# Patient Record
Sex: Male | Born: 1990 | Race: White | Hispanic: No | Marital: Single | Smoking: Never smoker
Health system: Southern US, Community
[De-identification: ages and names within clinical notes are randomized; demographics above are authoritative.]

## PROBLEM LIST (undated history)

## (undated) DIAGNOSIS — S0990XA Unspecified injury of head, initial encounter: Secondary | ICD-10-CM

## (undated) DIAGNOSIS — J189 Pneumonia, unspecified organism: Secondary | ICD-10-CM

## (undated) DIAGNOSIS — J45909 Unspecified asthma, uncomplicated: Secondary | ICD-10-CM

---
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
## 2011-05-02 ENCOUNTER — Emergency Department (HOSPITAL_COMMUNITY)
Admission: EM | Admit: 2011-05-02 | Discharge: 2011-05-02 | Disposition: A | Discharge status: Home / Self Care | Payer: Self-pay | Attending: Emergency Medicine | Admitting: Emergency Medicine

## 2011-05-02 DIAGNOSIS — J020 Streptococcal pharyngitis: Secondary | ICD-10-CM | POA: Insufficient documentation

## 2011-05-02 DIAGNOSIS — J351 Hypertrophy of tonsils: Secondary | ICD-10-CM | POA: Insufficient documentation

## 2011-10-17 HISTORY — PX: OTHER SURGICAL HISTORY: SHX169

## 2012-08-19 ENCOUNTER — Emergency Department (HOSPITAL_COMMUNITY)
Admission: EM | Admit: 2012-08-19 | Discharge: 2012-08-19 | Disposition: A | Discharge status: Home / Self Care | Payer: Managed Care, Other (non HMO) | Attending: Emergency Medicine | Admitting: Emergency Medicine

## 2012-08-19 ENCOUNTER — Emergency Department (HOSPITAL_COMMUNITY): Payer: Managed Care, Other (non HMO)

## 2012-08-19 ENCOUNTER — Encounter (HOSPITAL_COMMUNITY): Payer: Self-pay | Admitting: *Deleted

## 2012-08-19 DIAGNOSIS — J45909 Unspecified asthma, uncomplicated: Secondary | ICD-10-CM

## 2012-08-19 DIAGNOSIS — R059 Cough, unspecified: Secondary | ICD-10-CM | POA: Insufficient documentation

## 2012-08-19 DIAGNOSIS — J029 Acute pharyngitis, unspecified: Secondary | ICD-10-CM | POA: Insufficient documentation

## 2012-08-19 DIAGNOSIS — R5383 Other fatigue: Secondary | ICD-10-CM | POA: Insufficient documentation

## 2012-08-19 DIAGNOSIS — J069 Acute upper respiratory infection, unspecified: Secondary | ICD-10-CM | POA: Insufficient documentation

## 2012-08-19 DIAGNOSIS — Z87828 Personal history of other (healed) physical injury and trauma: Secondary | ICD-10-CM | POA: Insufficient documentation

## 2012-08-19 DIAGNOSIS — Z8701 Personal history of pneumonia (recurrent): Secondary | ICD-10-CM | POA: Insufficient documentation

## 2012-08-19 DIAGNOSIS — R51 Headache: Secondary | ICD-10-CM | POA: Insufficient documentation

## 2012-08-19 DIAGNOSIS — R6883 Chills (without fever): Secondary | ICD-10-CM | POA: Insufficient documentation

## 2012-08-19 DIAGNOSIS — J3489 Other specified disorders of nose and nasal sinuses: Secondary | ICD-10-CM | POA: Insufficient documentation

## 2012-08-19 DIAGNOSIS — R05 Cough: Secondary | ICD-10-CM

## 2012-08-19 DIAGNOSIS — R5381 Other malaise: Secondary | ICD-10-CM | POA: Insufficient documentation

## 2012-08-19 DIAGNOSIS — J45901 Unspecified asthma with (acute) exacerbation: Secondary | ICD-10-CM | POA: Insufficient documentation

## 2012-08-19 HISTORY — DX: Pneumonia, unspecified organism: J18.9

## 2012-08-19 HISTORY — DX: Unspecified injury of head, initial encounter: S09.90XA

## 2012-08-19 HISTORY — DX: Unspecified asthma, uncomplicated: J45.909

## 2012-08-19 MED ORDER — ALBUTEROL SULFATE (5 MG/ML) 0.5% IN NEBU
5.0000 mg | INHALATION_SOLUTION | Freq: Once | RESPIRATORY_TRACT | Status: AC
  Administered 2012-08-19: 5 mg via RESPIRATORY_TRACT
  Filled 2012-08-19: qty 1

## 2012-08-19 MED ORDER — AZITHROMYCIN 250 MG PO TABS: 250.0000 mg | ORAL_TABLET | Freq: Every day | ORAL | Status: DC

## 2012-08-19 MED ORDER — ALBUTEROL SULFATE HFA 108 (90 BASE) MCG/ACT IN AERS: 1.0000 | INHALATION_SPRAY | Freq: Four times a day (QID) | RESPIRATORY_TRACT | Status: DC | PRN

## 2012-08-19 NOTE — ED Notes (Signed)
Pt reports productive cough and chest congestion x1 week - unknown fever. Productive cough w/ green phlegm. Occasional body aches and chills - has been taking OTC medications w/ minimal relief. A&Ox4, no acute distress, resp even and unlabored.

## 2012-08-19 NOTE — Discharge Instructions (Signed)
Mr Gerald Zamora the chest x-ray shows no pneumonia today.  Use the inhaler no more than 4 times a day.  Start the antibiotics as soon as possible.  Take ibuprofen 600mg  every 6 hours with food x 24 hours.  Follow up with a pcp from the list below or one of your choice. Return to Er for uncontrolled fever, n/v or any concerns.  RESOURCE GUIDE  Chronic Pain Problems: Contact [facility]  [phone] Patients need to be referred by their primary care doctor.  Insufficient Money for Medicine: Contact United Way:  call "211" or [facility] (218)[phone].  No Primary Care Doctor: - Call [facility]  [phone] - can help you locate a primary care doctor that  accepts your insurance, provides certain services, etc. - Physician Referral Service- [phone]  Agencies that provide inexpensive medical care: - [facility]  [phone] - [facility]  [phone] - [facility]  [phone] - Women's Clinic  [phone] - Planned Parenthood  [phone] [facility]  [phone]  Medicaid-accepting [facility] Providers: - [facility]- [address]  [phone], Mon-Fri 9am-7pm, Sat 9am-1pm - [facility]- [address] [place], [address] [place]  [phone] - [facility]- [address] [place]  [phone] [facility]- [address]  [phone] - Renaye Rakers- [address] [place], [address], [phone]  Only accepts [place] patients after they have their name  applied to their card  Self Pay (no insurance) in [place]: - Sickle Cell Patients: Dr Willey Blade, [facility]  [address] [place], [phone] - [facility]- [address] [place]  [phone]       [facility]- [address]       -     [facility]- see information above (Speak to [employer]  if you do not have insurance)       -  [facility]- [address] [place], [phone]       -  [facility]- [address],  [phone]       -  [facility]- [address] [phone]       -  Dr Julio Sicks-  [address], [place], [phone]       -  [facility]- [address], [phone]       -  [facility]- [address] [phone], also [address], [phone]       -    [facility]- [address] [place], [phone], 1st & 3rd Saturday   every month, 10am-1pm  1) Find a Doctor and Pay Out of Pocket Although you won't have to find out who is covered by your insurance plan, it is a good idea to ask around and get recommendations. You will then need to call the office and see if the doctor you have chosen will accept you as a new patient and what types of options they offer for patients who are self-pay. Some doctors offer discounts or will set up payment plans for their patients who do not have insurance, but you will need to ask so you aren't surprised when you get to your appointment.  2) Contact Your Local  Health Department Not all health departments have doctors that can see patients for sick visits, but many do, so it is worth a call to see if yours does. If you don't know where your local health department is, you can check in your phone book. The CDC also has a tool to help you locate your state's health department, and many state websites also have listings of all of their local health departments.  3) Find a Walk-in Clinic If your illness is not likely to be very severe or complicated, you may want to try a walk in clinic. These are popping up all over the country in pharmacies, drugstores, and shopping centers. They're usually staffed by nurse practitioners or physician assistants that have been trained to treat common illnesses and complaints. They're usually fairly quick and inexpensive. However, if  you have serious medical issues or chronic medical problems, these are probably not your best option  STD Testing - [facility] Department of [facility], STD Clinic, [address] [place], phone [phone] or [phone].  Monday - Friday, call for an appointment. [facility] Department of [employer], STD Clinic, [address], [place], phone [phone] or [phone].  Monday - Friday, call for an appointment.  Abuse/Neglect: [facility] Child Abuse Hotline 817-864-0489 [facility] Child Abuse Hotline 810-553-6459 (After Hours)  Emergency Shelter:  [facility] (867) 376-5547  Maternity Homes: - [facility] (216) 880-2082 - [facility] (972)844-8689  MRSA Hotline #:   930 759 8928  [facility] Resources  [facility]  United [employer] [facility] Dept. [address]                 [address]         [address] [place] [address]  Blondell Reveal Phone:  [phone]                                  Phone:  [phone]                   Phone:  [phone]  [facility], [phone] - [phone]       -     [facility] in [place], [address]                                  [phone]) [phone], [facility] Child Abuse Hotline (873) 873-3946 or (808)104-0577 (After Hours)   Behavioral Health Services  Substance Abuse Resources: - [facility] Drug [facility]  4060600599 - [facility] 914-405-0877 - [facility] 947-189-5229 Floydene Flock (726)313-2797 - Residential & Outpatient Substance Abuse Program  (901)642-2456  Psychological Services: [facility]  [phone] [facility]  ([phone] -  [facility], [place] [address] [place], ACCESS LINE: [phone] or [phone], [email or web address]  Dental Assistance  If unable to pay or uninsured, contact:  [facility] or [facility]. to become qualified for the adult dental clinic.  Patients with Medicaid: [facility] [phone] [address], [phone] [address], [phone]  If unable to pay, or uninsured, contact [facility] [phone]) or [facility] Department [phone] in [place], [phone] in [facility]) to become qualified for the adult dental clinic  Other Low-Cost Community Dental Services: - [facility]- [address] [place], [place], [place], [address], [phone], [phone], 2nd and 4th Thursday of the month at 6:30am.  10 clients each day by appointment, can sometimes see walk-in patients if someone does not show for an appointment. [facility]- [address] [place], [place], [address], [phone] - [facility]- [address] [place], [place], [address], [phone] [facility] Health Department- [phone] [facility] Health Department- [phone] [facility] [phone]    Cough, Adult  A cough is a reflex. It helps you clear your throat and airways. A cough can help heal your body. A cough can last 2 or 3 weeks (acute) or may last more than 8 weeks (chronic). Some common causes of a cough can include an infection, allergy, or a cold. HOME CARE  Only take medicine as told by your doctor.  If given, take your medicines (antibiotics) as told. Finish them even if you start to feel better.  Use a cold steam vaporizer or humidier in your home. This can help loosen thick spit (secretions).  Sleep so you are almost sitting up (semi-upright). Use pillows to do this. This helps reduce coughing.  Rest as needed.  Stop smoking if you smoke. GET HELP RIGHT  AWAY IF:  You have yellowish-white fluid (pus) in your thick spit.  Your cough gets worse.  Your medicine does not reduce coughing, and you are losing sleep.  You cough up blood.  You have trouble breathing.  Your pain gets worse and medicine does not help.  You have a fever. MAKE SURE YOU:   Understand these instructions.  Will watch your condition.  Will get help right away if you are not doing well or get worse. Document Released: 06/15/2011 Document Revised: 12/25/2011 Document Reviewed: 06/15/2011 [facility] Patient Information 2013 [place], [place]. Cough, Adult  A cough is a reflex. It helps you clear your throat and airways. A cough can help heal your body. A cough can last 2 or 3 weeks (acute) or may last more than 8 weeks (chronic). Some common causes of a cough can include an infection, allergy, or a cold. HOME CARE  Only take medicine as told by your doctor.  If given, take your medicines (antibiotics) as told. Finish them even if you start to feel better.  Use a cold steam vaporizer or humidier in your home. This can help loosen thick spit (secretions).  Sleep so you are almost sitting up (semi-upright). Use pillows to do this. This helps reduce coughing.  Rest as needed.  Stop smoking if you smoke. GET HELP RIGHT AWAY IF:  You have yellowish-white fluid (pus) in your thick spit.  Your cough gets worse.  Your medicine does not reduce coughing, and you are losing sleep.  You cough up blood.  You have trouble breathing.  Your pain gets worse and medicine does not help.  You  have a fever. MAKE SURE YOU:   Understand these instructions.  Will watch your condition.  Will get help right away if you are not doing well or get worse. Document Released: 06/15/2011 Document Revised: 12/25/2011 Document Reviewed: 06/15/2011 [facility] Patient Information 2013 [place], [place].

## 2012-08-19 NOTE — ED Provider Notes (Signed)
History     CSN: [id number]  Arrival date & time 08/19/12  1847   First MD Initiated Contact with Patient 08/19/12 2137      Chief Complaint  Patient presents with  . Cough    (Consider location/radiation/quality/duration/timing/severity/associated sxs/prior treatment) Patient is a 21 y.o. male presenting with cough. The history is provided by the patient. No language interpreter was used.  Cough This is a new problem. The current episode started more than 1 week ago. The problem occurs every few minutes. The problem has been gradually worsening. The cough is productive of sputum. Associated symptoms include chills, sweats, headaches, rhinorrhea, sore throat and wheezing. Pertinent negatives include no chest pain, no ear congestion, no ear pain and no shortness of breath. He has tried decongestants for the symptoms. He is not a smoker. His past medical history is significant for asthma.   21 year old male with is reports cough greater than 7 days productive and green with chills and body aches.  States that he started wheezing 2 days ago. Denies fever but has had chills and sweating with sore throat. PMH asthma and pneumonia. Does not smoke    Past Medical History  Diagnosis Date  . Asthma   . Pneumonia   . Head trauma     History reviewed. No pertinent past surgical history.  History reviewed. No pertinent family history.  History  Substance Use Topics  . Smoking status: Never Smoker   . Smokeless tobacco: Not on file  . Alcohol Use: Yes     Comment: occasionally      Review of Systems  Constitutional: Positive for chills, diaphoresis and fatigue. Negative for fever.  HENT: Positive for sore throat and rhinorrhea. Negative for ear pain.   Eyes: Negative.   Respiratory: Positive for cough and wheezing. Negative for shortness of breath.   Cardiovascular: Negative.  Negative for chest pain.  Gastrointestinal: Negative.   Neurological: Positive for headaches.    Psychiatric/Behavioral: Negative.   All other systems reviewed and are negative.    Allergies  Review of patient's allergies indicates no known allergies.  Home Medications   Current Outpatient Rx  Name  Route  Sig  Dispense  Refill  . DM-PHENYLEPHRINE-ACETAMINOPHEN 10-5-325 MG/15ML PO LIQD   Oral   Take 15 mLs by mouth 3 (three) times daily as needed. For cold symptoms         . PSEUDOEPH-DOXYLAMINE-DM-APAP 60-12.03-14-999 MG/30ML PO LIQD   Oral   Take 30 mLs by mouth at bedtime as needed. For cold symptoms.Equate Nite Time           BP 138/75  Pulse 90  Temp 99.5 F (37.5 C) (Oral)  Resp 18  Ht 5\' 6"  (1.676 m)  Wt 174 lb (78.926 kg)  BMI 28.08 kg/m2  SpO2 98%  Physical Exam  Nursing note and vitals reviewed. Constitutional: He is oriented to person, place, and time. He appears well-developed and well-nourished.  HENT:  Head: Normocephalic.  Eyes: Conjunctivae normal and EOM are normal. Pupils are equal, round, and reactive to light.  Neck: Normal range of motion. Neck supple.  Cardiovascular: Normal rate.   Pulmonary/Chest: Effort normal. He has wheezes. He exhibits no tenderness.  Abdominal: Soft. He exhibits no distension.  Musculoskeletal: Normal range of motion.  Neurological: He is alert and oriented to person, place, and time.  Skin: Skin is warm and dry.  Psychiatric: He has a normal mood and affect.    ED Course  Procedures (including critical care time)  Labs Reviewed - No data to display Dg Chest 2 View  08/19/2012  *RADIOLOGY REPORT*  Clinical Data: Cough and cold symptoms x 6 days  CHEST - 2 VIEW  Comparison: None.  Findings: Rounded radiodensities projecting over the chest are compatible with buttons on clothing rather than pulmonary nodules.  Cardiac and mediastinal contours appear normal.  The lungs appear clear.  No pleural effusion is identified.  IMPRESSION:  No significant abnormality identified.   Original Report Authenticated By:  Gaylyn Rong, M.D.      No diagnosis found.    MDM  Upper respiratory infection with cough and wheezing.  Albuterol neb in the ER with improvement.  Chest x-ray unremarkable reviewed by myself.  Will use albuterol inhaler, ibuprofen and benadryl.  Understands to return for worsening symptoms with fever and SOB.  rx for z-pack. Follow up with pcp of choice this week.        Remi Haggard, NP 08/20/12 1153

## 2012-08-20 NOTE — ED Provider Notes (Signed)
Medical screening examination/treatment/procedure(s) were performed by non-physician practitioner and as supervising physician I was immediately available for consultation/collaboration.   Vanisha Whiten, MD 08/20/12 1540 

## 2012-09-17 ENCOUNTER — Encounter (HOSPITAL_COMMUNITY): Payer: Self-pay | Admitting: Emergency Medicine

## 2012-09-17 ENCOUNTER — Emergency Department (HOSPITAL_COMMUNITY): Payer: Managed Care, Other (non HMO)

## 2012-09-17 ENCOUNTER — Emergency Department (HOSPITAL_COMMUNITY)
Admission: EM | Admit: 2012-09-17 | Discharge: 2012-09-18 | Disposition: A | Discharge status: Home / Self Care | Payer: Managed Care, Other (non HMO) | Attending: Emergency Medicine | Admitting: Emergency Medicine

## 2012-09-17 DIAGNOSIS — R209 Unspecified disturbances of skin sensation: Secondary | ICD-10-CM | POA: Insufficient documentation

## 2012-09-17 DIAGNOSIS — Y9389 Activity, other specified: Secondary | ICD-10-CM | POA: Insufficient documentation

## 2012-09-17 DIAGNOSIS — R42 Dizziness and giddiness: Secondary | ICD-10-CM | POA: Insufficient documentation

## 2012-09-17 DIAGNOSIS — L02419 Cutaneous abscess of limb, unspecified: Secondary | ICD-10-CM | POA: Insufficient documentation

## 2012-09-17 DIAGNOSIS — L089 Local infection of the skin and subcutaneous tissue, unspecified: Secondary | ICD-10-CM | POA: Insufficient documentation

## 2012-09-17 DIAGNOSIS — Z79899 Other long term (current) drug therapy: Secondary | ICD-10-CM | POA: Insufficient documentation

## 2012-09-17 DIAGNOSIS — Z8701 Personal history of pneumonia (recurrent): Secondary | ICD-10-CM | POA: Insufficient documentation

## 2012-09-17 DIAGNOSIS — J45909 Unspecified asthma, uncomplicated: Secondary | ICD-10-CM | POA: Insufficient documentation

## 2012-09-17 DIAGNOSIS — Y929 Unspecified place or not applicable: Secondary | ICD-10-CM | POA: Insufficient documentation

## 2012-09-17 DIAGNOSIS — L0390 Cellulitis, unspecified: Secondary | ICD-10-CM

## 2012-09-17 DIAGNOSIS — Z87828 Personal history of other (healed) physical injury and trauma: Secondary | ICD-10-CM | POA: Insufficient documentation

## 2012-09-17 DIAGNOSIS — M795 Residual foreign body in soft tissue: Secondary | ICD-10-CM

## 2012-09-17 DIAGNOSIS — W278XXA Contact with other nonpowered hand tool, initial encounter: Secondary | ICD-10-CM | POA: Insufficient documentation

## 2012-09-17 MED ORDER — PIPERACILLIN-TAZOBACTAM 3.375 G IVPB
3.3750 g | Freq: Once | INTRAVENOUS | Status: AC
  Administered 2012-09-17: 3.375 g via INTRAVENOUS
  Filled 2012-09-17: qty 50
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
## 2012-09-17 NOTE — ED Notes (Signed)
Pt states he was splitting wood and a piece of metal off the ax came off and went into his left knee  Pt states he thought the metal came out  Pt has a small laceration noted to the inside of his left knee with redness around the laceration

## 2012-09-18 MED ORDER — CLINDAMYCIN HCL 300 MG PO CAPS: 300.0000 mg | ORAL_CAPSULE | Freq: Four times a day (QID) | ORAL | Status: DC

## 2012-09-18 MED ORDER — HYDROCODONE-ACETAMINOPHEN 5-325 MG PO TABS: 1.0000 | ORAL_TABLET | ORAL | Status: DC | PRN

## 2012-09-18 NOTE — ED Provider Notes (Signed)
Medical screening examination/treatment/procedure(s) were conducted as a shared visit with non-physician practitioner(s) and myself.  I personally evaluated the patient during the encounter.  21 year old male with a soft tissue foreign body in the area of the medial condyle of the left femur. Small area of surrounding cellulitis. Patient does have a small joint effusion but no significant pain with active or passive range of motion of the knee to suggest septic joint. Pease refer to PA procedure note. The foreign body was grasped with forceps and removed. Consistent in size and shape with what was seen on the plain films. It appeared to be removed intact. Wound copiously irrigated. Left open to heal by secondary intention. Plan continued antibiotics and local wound care. No hx of diabetes or other immunocompromising state.  Return precautions discussed. Outpt FU otherwise.  Raeford Razor, MD 09/18/12 [phone]

## 2012-09-21 NOTE — ED Provider Notes (Signed)
Medical screening examination/treatment/procedure(s) were performed by non-physician practitioner and as supervising physician I was immediately available for consultation/collaboration.  Kassie Keng, MD 09/21/12 1625 

## 2012-12-25 IMAGING — CR DG CHEST 2V
2 series · 2 of 2 positions shown · non-contrast
Comparison: None.

CLINICAL DATA: Cough and cold symptoms x 6 days

CHEST - 2 VIEW

[w chest pa]
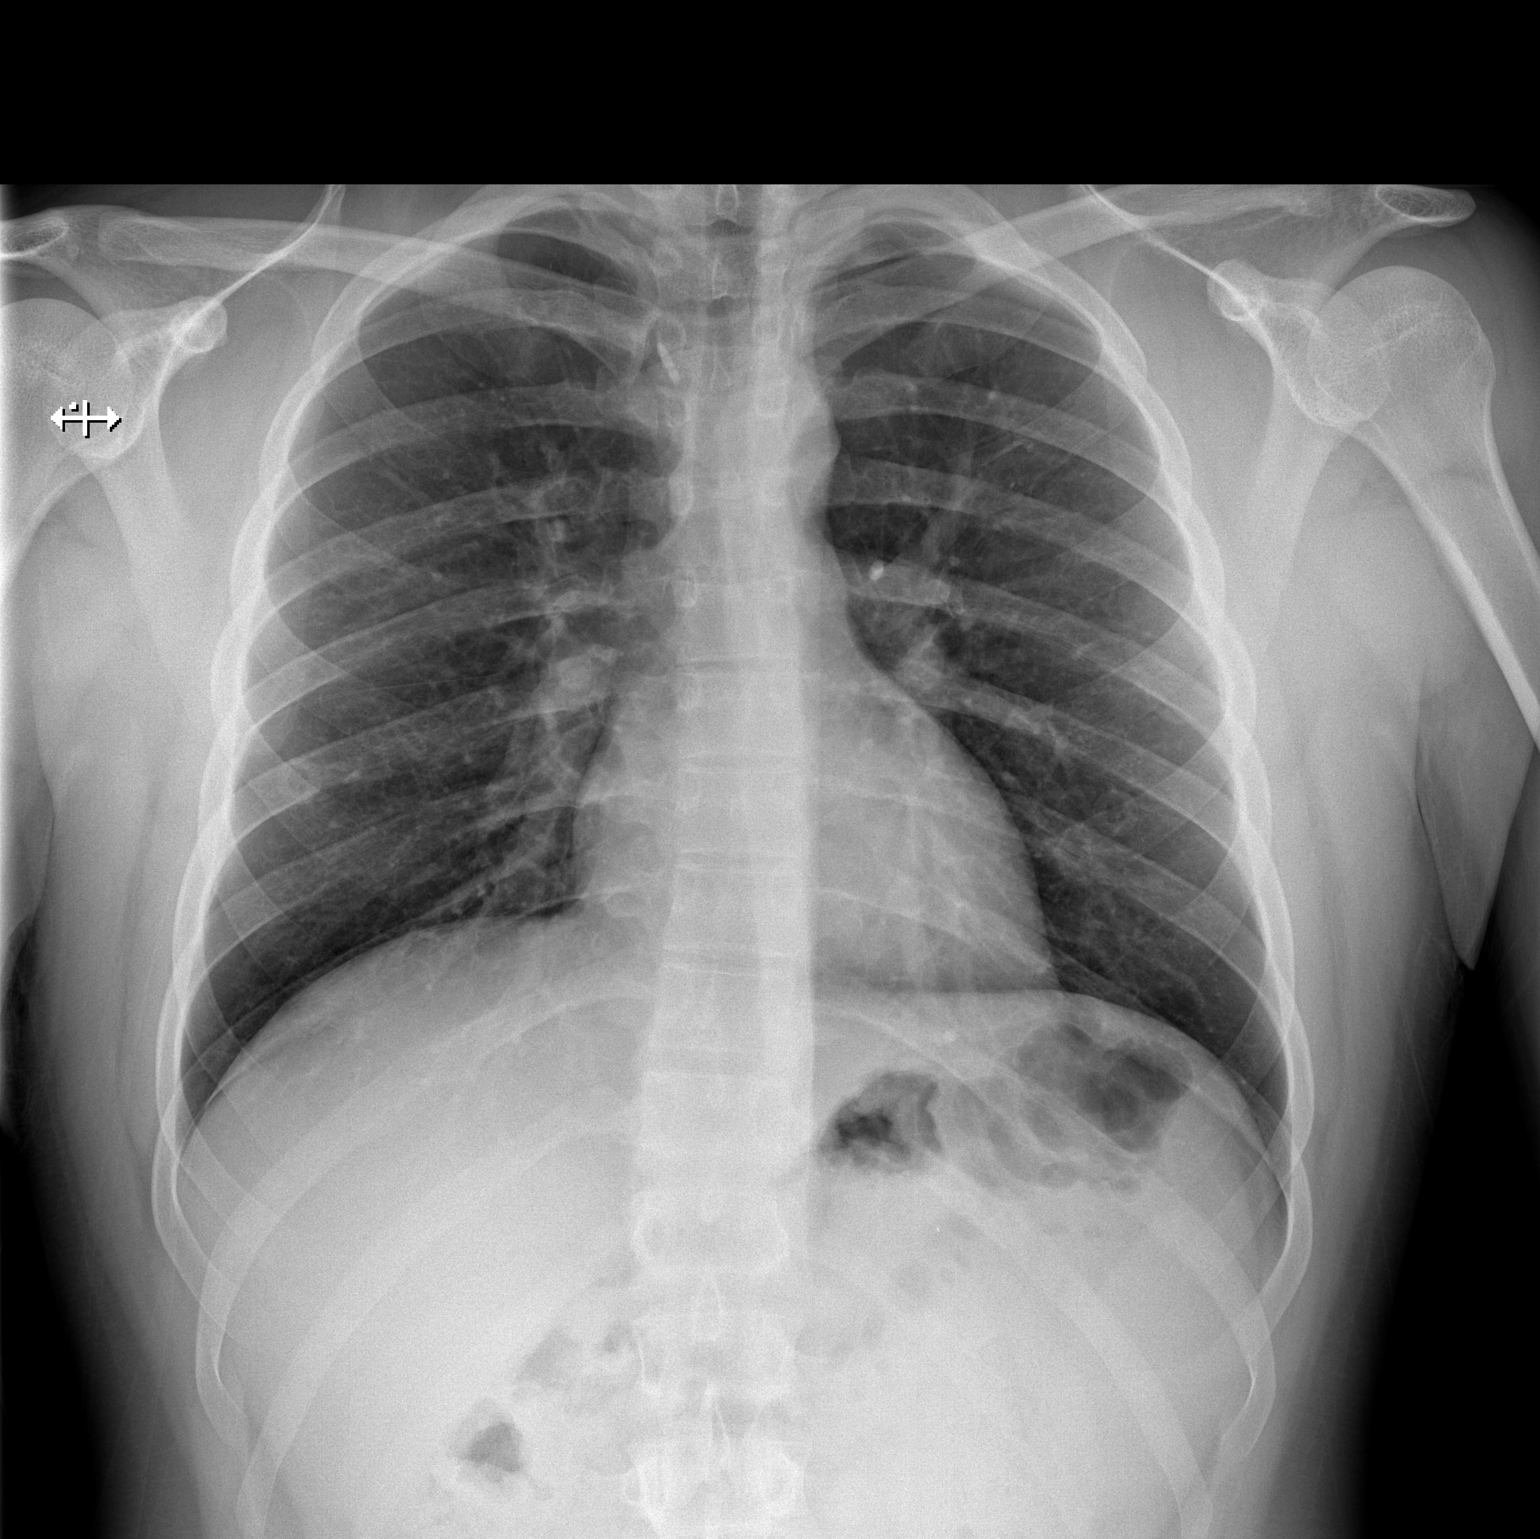

[w chest lat]
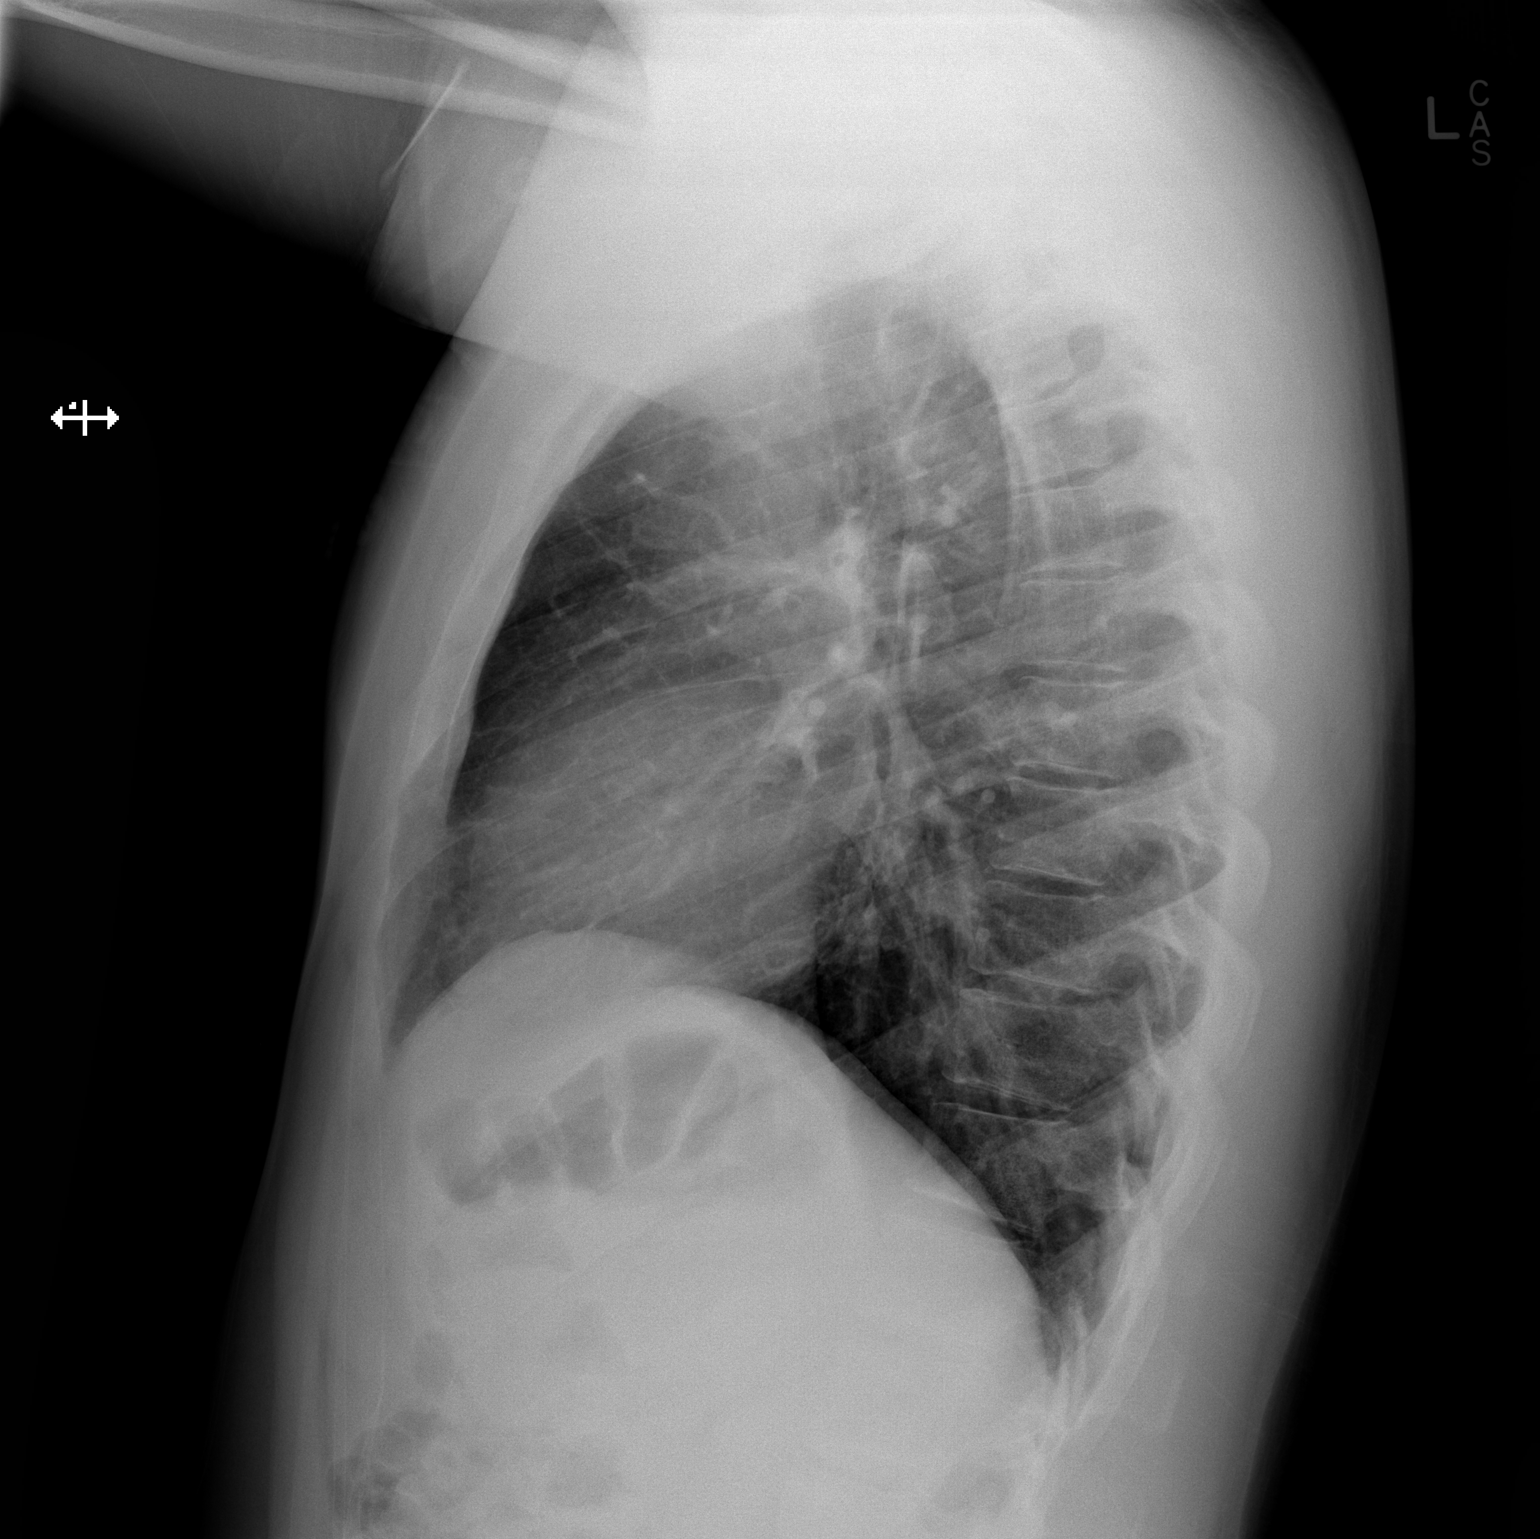

[2 of 2 positions shown; findings below may reference images not displayed]

FINDINGS: Rounded radiodensities projecting over the chest are
compatible with buttons on clothing rather than pulmonary nodules.

Cardiac and mediastinal contours appear normal.

The lungs appear clear.

No pleural effusion is identified.
IMPRESSION: No significant abnormality identified.

## 2014-08-13 ENCOUNTER — Encounter (HOSPITAL_COMMUNITY): Payer: Self-pay | Admitting: Emergency Medicine

## 2014-08-13 ENCOUNTER — Emergency Department (HOSPITAL_COMMUNITY)
Admission: EM | Admit: 2014-08-13 | Discharge: 2014-08-14 | Disposition: A | Discharge status: Home / Self Care | Payer: 59 | Attending: Emergency Medicine | Admitting: Emergency Medicine

## 2014-08-13 DIAGNOSIS — T25022A Burn of unspecified degree of left foot, initial encounter: Secondary | ICD-10-CM | POA: Diagnosis present

## 2014-08-13 DIAGNOSIS — X12XXXA Contact with other hot fluids, initial encounter: Secondary | ICD-10-CM | POA: Diagnosis not present

## 2014-08-13 DIAGNOSIS — Y9389 Activity, other specified: Secondary | ICD-10-CM | POA: Insufficient documentation

## 2014-08-13 DIAGNOSIS — T25222A Burn of second degree of left foot, initial encounter: Secondary | ICD-10-CM | POA: Insufficient documentation

## 2014-08-13 DIAGNOSIS — J45909 Unspecified asthma, uncomplicated: Secondary | ICD-10-CM | POA: Insufficient documentation

## 2014-08-13 DIAGNOSIS — Z8701 Personal history of pneumonia (recurrent): Secondary | ICD-10-CM | POA: Insufficient documentation

## 2014-08-13 DIAGNOSIS — Y9289 Other specified places as the place of occurrence of the external cause: Secondary | ICD-10-CM | POA: Diagnosis not present
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
## 2014-08-14 MED ORDER — HYDROMORPHONE HCL 2 MG/ML IJ SOLN
2.0000 mg | Freq: Once | INTRAMUSCULAR | Status: AC
  Administered 2014-08-14: 2 mg via INTRAVENOUS
  Filled 2014-08-14: qty 1

## 2014-08-14 MED ORDER — SILVER SULFADIAZINE 1 % EX CREA
TOPICAL_CREAM | Freq: Two times a day (BID) | CUTANEOUS | Status: DC
  Administered 2014-08-14: 02:00:00 via TOPICAL
  Filled 2014-08-14 (×2): qty 50

## 2014-08-14 MED ORDER — HYDROMORPHONE HCL 1 MG/ML IJ SOLN
1.0000 mg | Freq: Once | INTRAMUSCULAR | Status: AC
  Administered 2014-08-14: 1 mg via INTRAVENOUS
  Filled 2014-08-14: qty 1

## 2014-08-14 MED ORDER — OXYCODONE-ACETAMINOPHEN 5-325 MG PO TABS: 2.0000 | ORAL_TABLET | ORAL | Status: AC | PRN

## 2014-08-14 MED ORDER — SILVER SULFADIAZINE 1 % EX CREA: TOPICAL_CREAM | Freq: Two times a day (BID) | CUTANEOUS | Status: AC

## 2014-08-14 NOTE — ED Notes (Signed)
Pt chart opened to print work note.

## 2014-08-14 NOTE — ED Notes (Signed)
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
## 2014-08-14 NOTE — Discharge Instructions (Signed)
Keep wound clean, apply silvadene twice a day and apply clean bandage.  Return for recheck to the ED, urgent care, or your doctor in 2 days.   Burn Care Your skin is a natural barrier to infection. It is the largest organ of your body. Burns damage this natural protection. To help prevent infection, it is very important to follow your caregiver's instructions in the care of your burn. Burns are classified as:  First degree. There is only redness of the skin (erythema). No scarring is expected.  Second degree. There is blistering of the skin. Scarring may occur with deeper burns.  Third degree. All layers of the skin are injured, and scarring is expected. HOME CARE INSTRUCTIONS   Wash your hands well before changing your bandage.  Change your bandage as often as directed by your caregiver.  Remove the old bandage. If the bandage sticks, you may soak it off with cool, clean water.  Cleanse the burn thoroughly but gently with mild soap and water.  Pat the area dry with a clean, dry cloth.  Apply a thin layer of antibacterial cream to the burn.  Apply a clean bandage as instructed by your caregiver.  Keep the bandage as clean and dry as possible.  Elevate the affected area for the first 24 hours, then as instructed by your caregiver.  Only take over-the-counter or prescription medicines for pain, discomfort, or fever as directed by your caregiver. SEEK IMMEDIATE MEDICAL CARE IF:   You develop excessive pain.  You develop redness, tenderness, swelling, or red streaks near the burn.  The burned area develops yellowish-white fluid (pus) or a bad smell.  You have a fever. MAKE SURE YOU:   Understand these instructions.  Will watch your condition.  Will get help right away if you are not doing well or get worse. Document Released: 10/02/2005 Document Revised: 12/25/2011 Document Reviewed: 02/22/2011 [facility] Patient Information 2015 [place], [place]. This information is not  intended to replace advice given to you by your health care provider. Make sure you discuss any questions you have with your health care provider.  Second-Degree Burn A second-degree burn affects the 2 outer layers of skin. The outer layer (epidermis) and the layer underneath it (dermis) are both burned. Another name for this type of burn is a partial thickness burn. A second-degree burn may be called minor or major. This depends on the size of the burn. It also depends on what parts of the skin are burned. Minor burns may be treated with first aid. Major burns are a medical emergency. A second-degree burn is worse than a first-degree burn, but not as bad as a third-degree burn. A first-degree burn affects only the epidermis. A third-degree burn goes through all the layers of skin. A second-degree burn usually heals in 3 to 4 weeks. A minor second-degree burn usually does not leave a scar.Deeper second-degree burns may lead to scarring of the skin or contractures over joints.Contractures are scars that form over joints and may lead to reduced mobility at those joints. CAUSES  Heat (thermal) injury. This happens when skin comes in contact with something very hot. It could be a flame, a hot object, hot liquid, or steam. Most second-degree burns are thermal injuries.  Radiation. Sunlight is one type of radiation that can burn the skin. Another type of radiation is used to heat food. Radiation is also used to treat some diseases, such as cancer. All types of radiation can burn the skin. Sunlight usually causes  a first-degree burn. Radiation used for heating food or treating a disease can cause a second-degree burn.  Electricity. Electrical burns can cause more damage under the skin than on the surface. They should always be treated as major burns.  Chemicals. Many chemicals can burn the skin. The burn should be flushed with cool water and checked by an emergency caregiver. SYMPTOMS Symptoms of  second-degree burns include:  Severe pain.  Extreme tenderness.  Deep redness.  Blistered skin.  Skin that has changed color.It might look blotchy, wet, or shiny.  Swelling. TREATMENT Some second-degree burns may need to be treated in a hospital. These include major burns, electrical burns, and chemical burns. Many other second-degree burns can be treated with regular first aid, such as:  Cooling the burn. Use cool, germ-free (sterile) salt water. Place the burned area of skin into a tub of water, or cover the burned area with clean, wet towels.  Taking pain medicine.  Removing the dead skin from broken blisters. A trained caregiver may do this. Do not pop blisters.  Gently washing your skin with mild soap.  Covering the burned area with a cream.Silver sulfadiazine is a cream for burns. An antibiotic cream, such as bacitracin, may also be used to fight infection. Do not use other ointments or creams unless your caregiver says it is okay.  Protecting the burn with a sterile, non-sticky bandage.  Bandaging fingers and toes separately. This keeps them from sticking together.  Taking an antibiotic. This can help prevent infection.  Getting a tetanus shot. HOME CARE INSTRUCTIONS Medication  Take any medicine prescribed by your caregiver. Follow the directions carefully.  Ask your caregiver if you can take over-the-counter medicine to relieve pain and swelling. Do not give aspirin to children.  Make sure your caregiver knows about all other medicines you take.This includes over-the-counter medicines. Burn care  You will need to change the bandage on your burn. You may need to do this 2 or 3 times each day.  Gently clean the burned area.  Put ointment on it.  Cover the burn with a sterile bandage.  For some deeper burns or burns that cover a large area, compression garments may be prescribed. These garments can help minimize scarring and protect your mobility.  Do not  put butter or oil on your skin. Use only the cream prescribed by your caregiver.  Do not put ice on your burn.  Do not break blisters on your skin.  Keep the bandaged area dry. You might need to take a sponge bath for awhile.Ask your caregiver when you can take a shower or a tub bath again.  Do not scratch an itchy burn. Your caregiver may give you medicine to relieve very bad itching.  Infection is a big danger after a second-degree burn. Tell your caregiver right away if you have signs of infection, such as:  Redness or changing color in the burned area.  Fluid leaking from the burn.  Swelling in the burn area.  A bad smell coming from the wound. Follow-up  Keep all follow-up appointments.This is important. This is how your caregiver can tell if your treatment is working.  Protect your burn from sunlight.Use sunscreen whenever you go outside.Burned areas may be sensitive to the sun for up to 1 year. Exposure to the sun may also cause permanent darkening of scars. SEEK MEDICAL CARE IF:  You have any questions about medicines.  You have any questions about your treatment.  You wonder if it  is okay to do a particular activity.  You develop a fever of more than 100.5 F (38.1 C). SEEK IMMEDIATE MEDICAL CARE IF:  You think your burn might be infected. It may change color, become red, leak fluid, swell, or smell bad.  You develop a fever of more than 102 F (38.9 C). Document Released: 03/06/2011 Document Revised: 12/25/2011 Document Reviewed: 03/06/2011 [facility] Patient Information 2015 [place], [place]. This information is not intended to replace advice given to you by your health care provider. Make sure you discuss any questions you have with your health care provider.

## 2014-08-16 ENCOUNTER — Encounter (HOSPITAL_COMMUNITY): Payer: Self-pay | Admitting: Emergency Medicine

## 2014-08-16 ENCOUNTER — Emergency Department (HOSPITAL_COMMUNITY)
Admission: EM | Admit: 2014-08-16 | Discharge: 2014-08-16 | Disposition: A | Discharge status: Home / Self Care | Payer: 59 | Attending: Emergency Medicine | Admitting: Emergency Medicine

## 2014-08-16 DIAGNOSIS — Z87828 Personal history of other (healed) physical injury and trauma: Secondary | ICD-10-CM | POA: Diagnosis not present

## 2014-08-16 DIAGNOSIS — Z8701 Personal history of pneumonia (recurrent): Secondary | ICD-10-CM | POA: Diagnosis not present

## 2014-08-16 DIAGNOSIS — Y9289 Other specified places as the place of occurrence of the external cause: Secondary | ICD-10-CM | POA: Diagnosis not present

## 2014-08-16 DIAGNOSIS — Z79899 Other long term (current) drug therapy: Secondary | ICD-10-CM | POA: Insufficient documentation

## 2014-08-16 DIAGNOSIS — T25212D Burn of second degree of left ankle, subsequent encounter: Secondary | ICD-10-CM | POA: Insufficient documentation

## 2014-08-16 DIAGNOSIS — Y9389 Activity, other specified: Secondary | ICD-10-CM | POA: Diagnosis not present

## 2014-08-16 DIAGNOSIS — J45909 Unspecified asthma, uncomplicated: Secondary | ICD-10-CM | POA: Diagnosis not present

## 2014-08-16 DIAGNOSIS — X12XXXD Contact with other hot fluids, subsequent encounter: Secondary | ICD-10-CM | POA: Insufficient documentation

## 2014-08-16 DIAGNOSIS — IMO0002 Reserved for concepts with insufficient information to code with codable children: Secondary | ICD-10-CM

## 2014-08-16 DIAGNOSIS — T25012D Burn of unspecified degree of left ankle, subsequent encounter: Secondary | ICD-10-CM | POA: Diagnosis present

## 2014-08-16 LAB — CBG MONITORING, ED: Glucose-Capillary: 99 mg/dL (ref 70–99)

## 2014-08-16 MED ORDER — SILVER SULFADIAZINE 1 % EX CREA
TOPICAL_CREAM | Freq: Once | CUTANEOUS | Status: AC
  Administered 2014-08-16: 1 via TOPICAL
  Filled 2014-08-16: qty 50
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
## 2014-08-16 NOTE — Discharge Instructions (Signed)
Please call your doctor for a followup appointment within 24-48 hours. When you talk to your doctor please let them know that you were seen in the emergency department and have them acquire all of your records so that they can discuss the findings with you and formulate a treatment plan to fully care for your new and ongoing problems. Please call and set up an appointment with health and wellness Center to be seen and reassessed Please continue to apply Silvadene to the wound for proper healing Please continue to keep him covered with bandage Please continue to monitor symptoms closely and if symptoms are to worsen or change (fever greater than 101, chills, sweating, nausea, vomiting, chest pain, shortness of breathe, difficulty breathing, weakness, numbness, tingling, worsening or changes to pain pattern, Foot swelling, pus drainage, fever, hot to the touch, red streak running up the leg) please report back to the Emergency Department immediately.    Second-Degree Burn A second-degree burn affects the 2 outer layers of skin. The outer layer (epidermis) and the layer underneath it (dermis) are both burned. Another name for this type of burn is a partial thickness burn. A second-degree burn may be called minor or major. This depends on the size of the burn. It also depends on what parts of the skin are burned. Minor burns may be treated with first aid. Major burns are a medical emergency. A second-degree burn is worse than a first-degree burn, but not as bad as a third-degree burn. A first-degree burn affects only the epidermis. A third-degree burn goes through all the layers of skin. A second-degree burn usually heals in 3 to 4 weeks. A minor second-degree burn usually does not leave a scar.Deeper second-degree burns may lead to scarring of the skin or contractures over joints.Contractures are scars that form over joints and may lead to reduced mobility at those joints. CAUSES  Heat (thermal) injury.  This happens when skin comes in contact with something very hot. It could be a flame, a hot object, hot liquid, or steam. Most second-degree burns are thermal injuries.  Radiation. Sunlight is one type of radiation that can burn the skin. Another type of radiation is used to heat food. Radiation is also used to treat some diseases, such as cancer. All types of radiation can burn the skin. Sunlight usually causes a first-degree burn. Radiation used for heating food or treating a disease can cause a second-degree burn.  Electricity. Electrical burns can cause more damage under the skin than on the surface. They should always be treated as major burns.  Chemicals. Many chemicals can burn the skin. The burn should be flushed with cool water and checked by an emergency caregiver. SYMPTOMS Symptoms of second-degree burns include:  Severe pain.  Extreme tenderness.  Deep redness.  Blistered skin.  Skin that has changed color.It might look blotchy, wet, or shiny.  Swelling. TREATMENT Some second-degree burns may need to be treated in a hospital. These include major burns, electrical burns, and chemical burns. Many other second-degree burns can be treated with regular first aid, such as:  Cooling the burn. Use cool, germ-free (sterile) salt water. Place the burned area of skin into a tub of water, or cover the burned area with clean, wet towels.  Taking pain medicine.  Removing the dead skin from broken blisters. A trained caregiver may do this. Do not pop blisters.  Gently washing your skin with mild soap.  Covering the burned area with a cream.Silver sulfadiazine is a cream  for burns. An antibiotic cream, such as bacitracin, may also be used to fight infection. Do not use other ointments or creams unless your caregiver says it is okay.  Protecting the burn with a sterile, non-sticky bandage.  Bandaging fingers and toes separately. This keeps them from sticking together.  Taking an  antibiotic. This can help prevent infection.  Getting a tetanus shot. HOME CARE INSTRUCTIONS Medication  Take any medicine prescribed by your caregiver. Follow the directions carefully.  Ask your caregiver if you can take over-the-counter medicine to relieve pain and swelling. Do not give aspirin to children.  Make sure your caregiver knows about all other medicines you take.This includes over-the-counter medicines. Burn care  You will need to change the bandage on your burn. You may need to do this 2 or 3 times each day.  Gently clean the burned area.  Put ointment on it.  Cover the burn with a sterile bandage.  For some deeper burns or burns that cover a large area, compression garments may be prescribed. These garments can help minimize scarring and protect your mobility.  Do not put butter or oil on your skin. Use only the cream prescribed by your caregiver.  Do not put ice on your burn.  Do not break blisters on your skin.  Keep the bandaged area dry. You might need to take a sponge bath for awhile.Ask your caregiver when you can take a shower or a tub bath again.  Do not scratch an itchy burn. Your caregiver may give you medicine to relieve very bad itching.  Infection is a big danger after a second-degree burn. Tell your caregiver right away if you have signs of infection, such as:  Redness or changing color in the burned area.  Fluid leaking from the burn.  Swelling in the burn area.  A bad smell coming from the wound. Follow-up  Keep all follow-up appointments.This is important. This is how your caregiver can tell if your treatment is working.  Protect your burn from sunlight.Use sunscreen whenever you go outside.Burned areas may be sensitive to the sun for up to 1 year. Exposure to the sun may also cause permanent darkening of scars. SEEK MEDICAL CARE IF:  You have any questions about medicines.  You have any questions about your treatment.  You  wonder if it is okay to do a particular activity.  You develop a fever of more than 100.5 F (38.1 C). SEEK IMMEDIATE MEDICAL CARE IF:  You think your burn might be infected. It may change color, become red, leak fluid, swell, or smell bad.  You develop a fever of more than 102 F (38.9 C). Document Released: 03/06/2011 Document Revised: 12/25/2011 Document Reviewed: 03/06/2011 [facility] Patient Information 2015 [place], [place]. This information is not intended to replace advice given to you by your health care provider. Make sure you discuss any questions you have with your health care provider.   Emergency Department Resource Guide 1) Find a Doctor and Pay Out of Pocket Although you won't have to find out who is covered by your insurance plan, it is a good idea to ask around and get recommendations. You will then need to call the office and see if the doctor you have chosen will accept you as a new patient and what types of options they offer for patients who are self-pay. Some doctors offer discounts or will set up payment plans for their patients who do not have insurance, but you will need to ask so  you aren't surprised when you get to your appointment.  2) Contact Your Local Health Department Not all health departments have doctors that can see patients for sick visits, but many do, so it is worth a call to see if yours does. If you don't know where your local health department is, you can check in your phone book. The CDC also has a tool to help you locate your state's health department, and many state websites also have listings of all of their local health departments.  3) Find a Walk-in Clinic If your illness is not likely to be very severe or complicated, you may want to try a walk in clinic. These are popping up all over the country in pharmacies, drugstores, and shopping centers. They're usually staffed by nurse practitioners or physician assistants that have been trained to treat  common illnesses and complaints. They're usually fairly quick and inexpensive. However, if you have serious medical issues or chronic medical problems, these are probably not your best option.  No Primary Care Doctor: - Call Health Connect at  [phone] - they can help you locate a primary care doctor that  accepts your insurance, provides certain services, etc. - Physician Referral Service- [phone]  Chronic Pain Problems: Organization         Address  Phone   Notes  [facility]  [phone] Patients need to be referred by their primary care doctor.   Medication Assistance: Organization         Address  Phone   Notes  [facility] Medication [facility] [address] [place] [place], [place] [address] [phone] --Must be a resident of [facility] -- Must have NO insurance coverage whatsoever (no Medicaid/ Medicare, etc.) -- The pt. MUST have a primary care doctor that directs their care regularly and follows them in the community   MedAssist  [phone]   [employer]  [phone]    Agencies that provide inexpensive medical care: Organization         Address  Phone   Notes  [facility]  [phone]   [facility]    [phone]   [facility] [address] [place], [place] [address] [phone]   [facility] [address] [place], [place] [phone]   Planned Parenthood    [phone]   [facility]    [phone]   [facility]  [address], [place] Phone:  [phone], Fax:  [phone] Hours of Operation:  9 am - 6 pm, M-F.  Also accepts Medicaid/Medicare and self-pay.  [facility]  [address], [place] Phone: [phone], Fax: [phone]. Hours of Operation:  8:30 am - 5:30 pm, M-F.  Also accepts Medicaid and self-pay.  [facility] [place] [address], [place] Phone: [phone]   [facility] [address] Natasha Bence [place], [place] [phone], Ext. [phone] Mondays & Thursdays: 7-9 AM.  First 15 patients are seen on a first come, first serve basis.    Medicaid-accepting [facility] Providers:  Organization         Address  Phone   Notes  [facility] [address], [place] [phone] Also accepts self-pay patients.  [facility] [address] Laurell Josephs [place], [place]  [phone]  [facility] [address], [place] [phone]   [facility] [address] [place] [address], [place] [phone]   Renaye RakersVeita Bland [address], [place]   [phone] Only accepts [place] Access [place] patients after they have their name applied to their card.   Self-Pay (no insurance) in [facility]:  Organization         Address  Phone   Notes  Sickle Cell Patients, [facility] [address], [place] [phone]   [facility] [address], [place] [phone]   [facility]  [address], [place] [phone]   [facility]/Dr. Osei-Bonsu  [address] [place] [address], [place] or [phone], [place] [phone] Phone number for both [place] and [place] locations is the same.  [facility] [address], [place] [phone]   [facility] [address] [place] [address], [place] or [address] [phone]   [facility] [address], [place] [phone], phone; [phone], fax Sees patients 1st and 3rd Saturday of every month.  Must not qualify for public or private insurance (i.e. Medicaid, Medicare, [place] Health Choice, Veterans' Benefits)  Household income should be no more than 200% of the  poverty level The clinic cannot treat you if you are pregnant or think you are pregnant  Sexually transmitted diseases are not treated at the clinic.    Dental Care: Organization         Address  Phone  Notes  [facility] [address], [place] [phone] Accepts children up to age 24 who are enrolled in [place]icaid or [place] Health Choice; pregnant women with a Medicaid card; and children who have applied for Medicaid or [place] Health Choice, but were declined, whose parents can pay a reduced fee at time of service.  [facility] [place]  [address], [place] [phone] Accepts children up to age 24 who are enrolled in [place] or [place] Health Choice; pregnant women with a Medicaid card; and children who have applied for Medicaid or [place] Health Choice, but were declined, whose parents can pay a reduced fee at time of service.  [facility]  [address], [place] [phone] Patients are seen by appointment only. Walk-ins are not accepted. [facility] will see patients 24 years of age and older. Monday - Tuesday (8am-5pm) Most Wednesdays (8:30-5pm) $30 per visit, cash only  [facility]  [address], [facility] [phone] Patients are seen by appointment only. Walk-ins are not accepted. [facility] will see patients 24 years of age and older. One Wednesday Evening (Monthly: Volunteer Based).  $30 per visit, cash only  [employer]  [phone] for adults; Children under age 824, call Graduate Pediatric Dentistry at [phone]. Children aged 214-14, please call [phone] to request a pediatric application.  Dental services are provided in all areas of dental care including fillings, crowns and bridges, complete and partial dentures, implants, gum treatment, root canals, and extractions.  Preventive care is also provided. Treatment is provided to both adults and children. Patients are selected via a lottery and there is often a waiting list.  [facility] [address], [place]  [phone] [email or web address]   [facility] [address], [address] [place], [place] [phone], Ext. [phone] Second and Fourth Thursday of each month, opens at 6:30 AM; Clinic ends at 9 AM.  Patients are seen on a first-come first-served basis, and a limited number are seen during each clinic.   [facility]  [address], [place], [place] [phone]   Eligibility Requirements You must have lived in [place], [place], or [place] counties for at least the last three months.   You cannot be eligible for state or federal sponsored National Cityhealthcare insurance, including CIGNAVeterans Administration, [place]icaid, or [employer]Medicare.   You generally cannot be eligible for healthcare insurance through your employer.    How to apply: Eligibility screenings are held every Tuesday and Wednesday afternoon from 1:00 pm until 4:00 pm. You do not need an appointment for the interview!  [facility] [address], [place], [place] [phone]   [facility]  [phone]   [facility]  [phone]   [facility]  [phone]    Behavioral Health Resources in the Community: Intensive Outpatient Programs Organization         Address  Phone  Notes  [facility] [address], [place], [place] [phone]   [facility] [address], [place], [place] [phone]   [facility] [address], [place], [place]  [phone]   [facility] [address],  [place], [place] [phone] or [phone]   Substance Abuse Resources Organization         Address  Phone  Notes  [facility]  [phone]   Addiction  [facility]  [phone]   [facility]  [phone]   [facility]  [phone]   Residential & Outpatient Substance Abuse Program  [phone]   Psychological Services Organization         Address  Phone  Notes  [facility]  [phone]- [phone]   [facility]  [phone]- [phone]   [facility] [address], [place] [phone] or [phone]    Mobile Crisis Teams Organization         Address  Phone  Notes  [facility], Mobile Crisis Care Unit  [phone]   [facility]  [address] [place], [place] [phone]   Doristine LocksSharon DeEsch [address] [place] [phone]    Self-Help/Support Groups Organization         Address  Phone             Notes  [facility] [place] - variety of support groups  336- [phone] Call for more information  Narcotics Anonymous (NA), [facility]aring [facility] [address], [place]  2 meetings at this location   Statisticianesidential Treatment Programs Organization         Address  Phone  Notes  [facility] [address],    [place]  [phone]   [facility]  [address], [place] [id number], [place], [place] [phone]   [facility] [address], [place] [phone] Admissions: 8am-3pm M-F  [facility] [address]    [place], [place] [phone]   [facility] [address], [place], [place] [phone]   [facility] [address]  [place], [place] [phone]   [facility] - Intensive Outpatient [address] Laurell Josephs [address], [place], [place] [phone]   [facility] [address] [place] [address] [place].,  [place], [place] [phone] or [phone]   [facility] ([facility]) [address], [place], [place] [phone] Accepts Medicaid  [facility] [address]  [place]  [phone] Substance Abuse/Addiction Treatment   [facility] Organization         Address  Phone  Notes  [facility]  [phone]   Angie Fava, PhD [address] Ervin Knack [place], [place]   [phone] or [phone]   [facility] Behavioral   [address] [place], [place] [phone]   [facility] [address], [place], [place] [phone] Insurance/Medicaid/sponsorship through [facility] [address]                                    [place], [place] [phone] Therapy/tele-psych/case  [facility] [address], [place] [phone]    Dr. Lolly Mustache  [phone]   [facility] [place]  [employer] [facility] Dept. 1) [address] [place] 2) [address], [place] 3)  [address], [place] [phone]  [phone]   [facility] Child Abuse Hotline 629-523-9068 or [phone] (After Hours)
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
## 2022-11-26 ENCOUNTER — Ambulatory Visit
Admission: EM | Admit: 2022-11-26 | Discharge: 2022-11-26 | Disposition: A | Discharge status: Home / Self Care | Payer: Self-pay | Attending: Internal Medicine | Admitting: Internal Medicine

## 2022-11-26 ENCOUNTER — Ambulatory Visit (INDEPENDENT_AMBULATORY_CARE_PROVIDER_SITE_OTHER): Payer: Self-pay

## 2022-11-26 DIAGNOSIS — J029 Acute pharyngitis, unspecified: Secondary | ICD-10-CM | POA: Insufficient documentation

## 2022-11-26 DIAGNOSIS — R059 Cough, unspecified: Secondary | ICD-10-CM

## 2022-11-26 DIAGNOSIS — R051 Acute cough: Secondary | ICD-10-CM | POA: Insufficient documentation

## 2022-11-26 DIAGNOSIS — J069 Acute upper respiratory infection, unspecified: Secondary | ICD-10-CM | POA: Insufficient documentation

## 2022-11-26 LAB — POCT RAPID STREP A (OFFICE): Rapid Strep A Screen: NEGATIVE

## 2022-11-26 MED ORDER — AZITHROMYCIN 250 MG PO TABS: ORAL_TABLET | ORAL | 0 refills | Status: AC

## 2022-11-26 MED ORDER — PREDNISONE 20 MG PO TABS: 40.0000 mg | ORAL_TABLET | Freq: Every day | ORAL | 0 refills | Status: AC

## 2022-11-26 NOTE — ED Triage Notes (Addendum)
Pt presents to [facility] with co os congestion hoarsens and sinus pain has been taking musinex and otc allergy medication pt has had pnemonia multipul times and is concerned he may be developing it again

## 2022-11-26 NOTE — ED Provider Notes (Signed)
[facility]    CSN: [id number] Arrival date & time: 11/26/22  1051      History   Chief Complaint Chief Complaint  Patient presents with   Facial Pain   Nasal Congestion    HPI Gerald Zamora is a 32 y.o. male.   Patient presents with nasal congestion, sore throat, cough.  Patient reports that nasal congestion has been present for about 9 days.  Cough developed 2 days ago and is productive with green sputum.  He denies any documented fevers at home.  Reports that he did have a known sick contact when symptoms first started.  Reports history of asthma but has not had complications in multiple years.  He denies chest pain or shortness of breath.  Patient has taken Mucinex with minimal improvement in symptoms.  Concerned for pneumonia and is concerned about this today given that symptoms feel similar.     Past Medical History:  Diagnosis Date   Asthma    Head trauma    Pneumonia     There are no problems to display for this patient.   Past Surgical History:  Procedure Laterality Date   metal extraction to left knee  2013       Home Medications    Prior to Admission medications   Medication Sig Start Date End Date Taking? Authorizing Provider  azithromycin (ZITHROMAX Z-PAK) 250 MG tablet Take 2 tablets (500 mg total) by mouth daily for 1 day, THEN 1 tablet (250 mg total) daily for 4 days. 11/26/22 12/01/22 Yes [place] Lohr, Michele Rockers, FNP  predniSONE (DELTASONE) 20 MG tablet Take 2 tablets (40 mg total) by mouth daily for 5 days. 11/26/22 12/01/22 Yes Adeleigh Barletta, Michele Rockers, FNP  acetaminophen (TYLENOL) 500 MG tablet Take 1,000 mg by mouth every 6 (six) hours as needed for moderate pain.    [provider]  oxyCODONE-acetaminophen (PERCOCET/ROXICET) 5-325 MG per tablet Take 2 tablets by mouth every 4 (four) hours as needed for severe pain. 08/14/14   Linton Flemings, MD  silver sulfADIAZINE (SILVADENE) 1 % cream Apply topically 2 (two) times daily. 08/14/14   Linton Flemings,  MD    Family History Family History  Problem Relation Age of Onset   Diabetes Other    CAD Other     Social History Social History   Tobacco Use   Smoking status: Never  Substance Use Topics   Alcohol use: Yes    Comment: occasionally   Drug use: No     Allergies   Sulfa antibiotics   Review of Systems Review of Systems Per HPI  Physical Exam Triage Vital Signs ED Triage Vitals  Enc Vitals Group     BP 11/26/22 1142 (!) 147/94     Pulse Rate 11/26/22 1140 81     Resp 11/26/22 1140 (!) 22     Temp 11/26/22 1140 98.3 F (36.8 C)     Temp src --      SpO2 11/26/22 1140 98 %     Weight --      Height --      Head Circumference --      Peak Flow --      Pain Score 11/26/22 1139 6     Pain Loc --      Pain Edu? --      Excl. in [place]? --    No data found.  Updated Vital Signs BP (!) 147/94   Pulse 81   Temp 98.3 F (36.8 C)  Resp (!) 22   SpO2 98%   Visual Acuity Right Eye Distance:   Left Eye Distance:   Bilateral Distance:    Right Eye Near:   Left Eye Near:    Bilateral Near:     Physical Exam Constitutional:      General: He is not in acute distress.    Appearance: Normal appearance. He is not toxic-appearing or diaphoretic.  HENT:     Head: Normocephalic and atraumatic.     Right Ear: Tympanic membrane and ear canal normal.     Left Ear: Tympanic membrane and ear canal normal.     Nose: Congestion present.     Mouth/Throat:     Mouth: Mucous membranes are moist.     Pharynx: Posterior oropharyngeal erythema present.  Eyes:     Extraocular Movements: Extraocular movements intact.     Conjunctiva/sclera: Conjunctivae normal.     Pupils: Pupils are equal, round, and reactive to light.  Cardiovascular:     Rate and Rhythm: Normal rate and regular rhythm.     Pulses: Normal pulses.     Heart sounds: Normal heart sounds.  Pulmonary:     Effort: Pulmonary effort is normal. No respiratory distress.     Breath sounds: Normal breath  sounds. No stridor. No wheezing, rhonchi or rales.  Abdominal:     General: Abdomen is flat. Bowel sounds are normal.     Palpations: Abdomen is soft.  Musculoskeletal:        General: Normal range of motion.     Cervical back: Normal range of motion.  Skin:    General: Skin is warm and dry.  Neurological:     General: No focal deficit present.     Mental Status: He is alert and oriented to person, place, and time. Mental status is at baseline.  Psychiatric:        Mood and Affect: Mood normal.        Behavior: Behavior normal.      UC Treatments / Results  Labs (all labs ordered are listed, but only abnormal results are displayed) Labs Reviewed  CULTURE, GROUP A STREP [facility])  POCT RAPID STREP A (OFFICE)    EKG   Radiology DG Chest 2 View  Result Date: 11/26/2022 CLINICAL DATA:  Cough EXAM: CHEST - 2 VIEW COMPARISON:  06/11/2021 FINDINGS: The heart size and mediastinal contours are within normal limits. Both lungs are clear. The visualized skeletal structures are unremarkable. IMPRESSION: No active cardiopulmonary disease. Electronically Signed   By: Kathreen Devoid M.D.   On: 11/26/2022 12:50    Procedures Procedures (including critical care time)  Medications Ordered in UC Medications - No data to display  Initial Impression / Assessment and Plan / UC Course  I have reviewed the triage vital signs and the nursing notes.  Pertinent labs & imaging results that were available during my care of the patient were reviewed by me and considered in my medical decision making (see chart for details).     Patient's symptoms most likely started off as a viral illness but given duration of symptoms, there is concern for secondary bacterial infection.  Chest x-ray was completed that was negative for any acute cardiopulmonary process.  Patient possibly has acute bronchitis.  Also concerned for sinus infection given nasal congestion is worsening.  Will treat with azithromycin.   Prednisone also prescribed for patient to decrease inflammation.  Patient has taken prednisone before and tolerated well.  Advised supportive care.  Do  not think viral testing is necessary given duration of symptoms as it would not change treatment.  Patient to follow-up if symptoms persist or worsen.  Patient verbalized understanding and was agreeable with plan. Final Clinical Impressions(s) / UC Diagnoses   Final diagnoses:  Sore throat  Acute upper respiratory infection  Acute cough     Discharge Instructions      Your chest x-ray was normal.  I am prescribing prednisone and antibiotic to treat upper respiratory infection.  Please follow-up if any symptoms persist or worsen.     ED Prescriptions     Medication Sig Dispense Auth. Provider   predniSONE (DELTASONE) 20 MG tablet Take 2 tablets (40 mg total) by mouth daily for 5 days. 10 tablet [place], [place] E, Daisy   azithromycin (ZITHROMAX Z-PAK) 250 MG tablet Take 2 tablets (500 mg total) by mouth daily for 1 day, THEN 1 tablet (250 mg total) daily for 4 days. 6 tablet [place], Michele Rockers, [place]      PDMP not reviewed this encounter.   Teodora Medici, [place] 11/26/22 [phone]

## 2022-11-26 NOTE — Discharge Instructions (Signed)
Your chest x-ray was normal.  I am prescribing prednisone and antibiotic to treat upper respiratory infection.  Please follow-up if any symptoms persist or worsen.

## 2022-11-27 LAB — CULTURE, GROUP A STREP (THRC)

## 2022-11-28 LAB — CULTURE, GROUP A STREP (THRC)

## 2024-02-06 ENCOUNTER — Emergency Department (HOSPITAL_COMMUNITY)
Admission: EM | Admit: 2024-02-06 | Discharge: 2024-02-06 | Disposition: A | Discharge status: Home / Self Care | Payer: Self-pay | Attending: Emergency Medicine | Admitting: Emergency Medicine

## 2024-02-06 ENCOUNTER — Other Ambulatory Visit: Payer: Self-pay

## 2024-02-06 ENCOUNTER — Emergency Department (HOSPITAL_COMMUNITY): Payer: Self-pay

## 2024-02-06 DIAGNOSIS — Y92410 Unspecified street and highway as the place of occurrence of the external cause: Secondary | ICD-10-CM | POA: Insufficient documentation

## 2024-02-06 DIAGNOSIS — T07XXXA Unspecified multiple injuries, initial encounter: Secondary | ICD-10-CM

## 2024-02-06 DIAGNOSIS — J45909 Unspecified asthma, uncomplicated: Secondary | ICD-10-CM | POA: Insufficient documentation

## 2024-02-06 DIAGNOSIS — S40812A Abrasion of left upper arm, initial encounter: Secondary | ICD-10-CM | POA: Insufficient documentation

## 2024-02-06 NOTE — Discharge Instructions (Signed)
 No broken bones today.  You can continue to clean with soap and water and apply Vaseline until it is healed.
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
